# Patient Record
Sex: Male | Born: 1990 | Race: Black or African American | Hispanic: No | Marital: Single
Health system: Southern US, Community
[De-identification: ages and names within clinical notes are randomized; demographics above are authoritative.]

## PROBLEM LIST (undated history)

## (undated) DIAGNOSIS — S161XXA Strain of muscle, fascia and tendon at neck level, initial encounter: Secondary | ICD-10-CM

---

## 2019-12-07 ENCOUNTER — Emergency Department (HOSPITAL_COMMUNITY): Payer: No Typology Code available for payment source

## 2019-12-07 ENCOUNTER — Emergency Department (HOSPITAL_COMMUNITY)
Admission: EM | Admit: 2019-12-07 | Discharge: 2019-12-07 | Disposition: A | Discharge status: Home / Self Care | Payer: No Typology Code available for payment source | Attending: Emergency Medicine | Admitting: Emergency Medicine

## 2019-12-07 ENCOUNTER — Other Ambulatory Visit: Payer: Self-pay

## 2019-12-07 DIAGNOSIS — Y9301 Activity, walking, marching and hiking: Secondary | ICD-10-CM | POA: Insufficient documentation

## 2019-12-07 DIAGNOSIS — S79922A Unspecified injury of left thigh, initial encounter: Secondary | ICD-10-CM | POA: Diagnosis present

## 2019-12-07 DIAGNOSIS — Y92410 Unspecified street and highway as the place of occurrence of the external cause: Secondary | ICD-10-CM | POA: Insufficient documentation

## 2019-12-07 DIAGNOSIS — Y999 Unspecified external cause status: Secondary | ICD-10-CM | POA: Diagnosis not present

## 2019-12-07 DIAGNOSIS — S7012XA Contusion of left thigh, initial encounter: Secondary | ICD-10-CM

## 2019-12-07 DIAGNOSIS — R079 Chest pain, unspecified: Secondary | ICD-10-CM | POA: Insufficient documentation

## 2019-12-07 LAB — CBC WITH DIFFERENTIAL/PLATELET
Abs Immature Granulocytes: 0.02 10*3/uL (ref 0.00–0.07)
Basophils Absolute: 0.1 10*3/uL (ref 0.0–0.1)
Basophils Relative: 1 %
Eosinophils Absolute: 0.1 10*3/uL (ref 0.0–0.5)
Eosinophils Relative: 2 %
HCT: 47.8 % (ref 39.0–52.0)
Hemoglobin: 16.4 g/dL (ref 13.0–17.0)
Immature Granulocytes: 0 %
Lymphocytes Relative: 37 %
Lymphs Abs: 2.9 10*3/uL (ref 0.7–4.0)
MCH: 33.7 pg (ref 26.0–34.0)
MCHC: 34.3 g/dL (ref 30.0–36.0)
MCV: 98.2 fL (ref 80.0–100.0)
Monocytes Absolute: 0.6 10*3/uL (ref 0.1–1.0)
Monocytes Relative: 7 %
Neutro Abs: 4.1 10*3/uL (ref 1.7–7.7)
Neutrophils Relative %: 53 %
Platelets: 186 10*3/uL (ref 150–400)
RBC: 4.87 MIL/uL (ref 4.22–5.81)
RDW: 12.4 % (ref 11.5–15.5)
WBC: 7.8 10*3/uL (ref 4.0–10.5)
nRBC: 0 % (ref 0.0–0.2)

## 2019-12-07 LAB — COMPREHENSIVE METABOLIC PANEL
ALT: 44 U/L (ref 0–44)
AST: 43 U/L — ABNORMAL HIGH (ref 15–41)
Albumin: 4.1 g/dL (ref 3.5–5.0)
Alkaline Phosphatase: 76 U/L (ref 38–126)
Anion gap: 7 (ref 5–15)
BUN: 11 mg/dL (ref 6–20)
CO2: 24 mmol/L (ref 22–32)
Calcium: 9 mg/dL (ref 8.9–10.3)
Chloride: 108 mmol/L (ref 98–111)
Creatinine, Ser: 1.02 mg/dL (ref 0.61–1.24)
GFR calc Af Amer: >60 mL/min (ref >60)
GFR calc non Af Amer: >60 mL/min (ref >60)
Glucose, Bld: 93 mg/dL (ref 70–99)
Potassium: 3.4 mmol/L — ABNORMAL LOW (ref 3.5–5.1)
Sodium: 139 mmol/L (ref 135–145)
Total Bilirubin: 0.6 mg/dL (ref 0.3–1.2)
Total Protein: 7.6 g/dL (ref 6.5–8.1)

## 2019-12-07 MED ORDER — ACETAMINOPHEN 500 MG PO TABS
1000.0000 mg | ORAL_TABLET | Freq: Once | ORAL | Status: AC
  Administered 2019-12-07: 1000 mg via ORAL
  Filled 2019-12-07: qty 2
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
## 2020-10-23 ENCOUNTER — Emergency Department (HOSPITAL_COMMUNITY): Payer: Self-pay

## 2020-10-23 ENCOUNTER — Emergency Department (HOSPITAL_COMMUNITY)
Admission: EM | Admit: 2020-10-23 | Discharge: 2020-10-23 | Disposition: A | Discharge status: Home / Self Care | Payer: Self-pay | Attending: Emergency Medicine | Admitting: Emergency Medicine

## 2020-10-23 DIAGNOSIS — Z23 Encounter for immunization: Secondary | ICD-10-CM | POA: Insufficient documentation

## 2020-10-23 DIAGNOSIS — S0993XA Unspecified injury of face, initial encounter: Secondary | ICD-10-CM

## 2020-10-23 DIAGNOSIS — W320XXA Accidental handgun discharge, initial encounter: Secondary | ICD-10-CM | POA: Insufficient documentation

## 2020-10-23 DIAGNOSIS — S0101XA Laceration without foreign body of scalp, initial encounter: Secondary | ICD-10-CM

## 2020-10-23 LAB — CBC
HCT: 45.8 % (ref 39.0–52.0)
Hemoglobin: 15.3 g/dL (ref 13.0–17.0)
MCH: 32.6 pg (ref 26.0–34.0)
MCHC: 33.4 g/dL (ref 30.0–36.0)
MCV: 97.4 fL (ref 80.0–100.0)
Platelets: 232 10*3/uL (ref 150–400)
RBC: 4.7 MIL/uL (ref 4.22–5.81)
RDW: 11.9 % (ref 11.5–15.5)
WBC: 8.4 10*3/uL (ref 4.0–10.5)
nRBC: 0 % (ref 0.0–0.2)

## 2020-10-23 LAB — COMPREHENSIVE METABOLIC PANEL
ALT: 21 U/L (ref 0–44)
AST: 30 U/L (ref 15–41)
Albumin: 3.9 g/dL (ref 3.5–5.0)
Alkaline Phosphatase: 61 U/L (ref 38–126)
Anion gap: 13 (ref 5–15)
BUN: 16 mg/dL (ref 6–20)
CO2: 19 mmol/L — ABNORMAL LOW (ref 22–32)
Calcium: 9.4 mg/dL (ref 8.9–10.3)
Chloride: 107 mmol/L (ref 98–111)
Creatinine, Ser: 1.06 mg/dL (ref 0.61–1.24)
GFR, Estimated: >60 mL/min (ref >60)
Glucose, Bld: 90 mg/dL (ref 70–99)
Potassium: 3.9 mmol/L (ref 3.5–5.1)
Sodium: 139 mmol/L (ref 135–145)
Total Bilirubin: 0.5 mg/dL (ref 0.3–1.2)
Total Protein: 7.2 g/dL (ref 6.5–8.1)

## 2020-10-23 LAB — ETHANOL: Alcohol, Ethyl (B): 143 mg/dL — ABNORMAL HIGH (ref <10)

## 2020-10-23 MED ORDER — LIDOCAINE HCL (PF) 1 % IJ SOLN
30.0000 mL | Freq: Once | INTRAMUSCULAR | Status: DC
  Filled 2020-10-23: qty 30

## 2020-10-23 MED ORDER — TETANUS-DIPHTH-ACELL PERTUSSIS 5-2.5-18.5 LF-MCG/0.5 IM SUSY
0.5000 mL | PREFILLED_SYRINGE | Freq: Once | INTRAMUSCULAR | Status: AC
  Administered 2020-10-23: 0.5 mL via INTRAMUSCULAR
  Filled 2020-10-23: qty 0.5

## 2020-10-23 NOTE — Discharge Instructions (Addendum)
You were evaluated in the Emergency Department and after careful evaluation, we did not find any emergent condition requiring admission or further testing in the hospital.  Your exam/testing today was overall reassuring.  CT scans did not show any bleeding on the brain.  We offered suture repair of your scalp laceration, but you declined.  Recommend keeping this area clean and dry with dressing in place.  Please return to the Emergency Department if you experience any worsening of your condition.  Thank you for allowing [place] to be a part of your care.
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
## 2021-11-09 ENCOUNTER — Inpatient Hospital Stay
Admit: 2021-11-09 | Discharge: 2021-11-10 | Disposition: A | Discharge status: Home / Self Care | Attending: Emergency Medicine

## 2021-11-09 ENCOUNTER — Emergency Department: Admit: 2021-11-09

## 2021-11-09 ENCOUNTER — Emergency Department

## 2021-11-09 DIAGNOSIS — S161XXA Strain of muscle, fascia and tendon at neck level, initial encounter: Secondary | ICD-10-CM

## 2021-11-09 MED ORDER — ACETAMINOPHEN 325 MG TABLET
325 mg | ORAL | Status: AC
Start: 2021-11-09 — End: 2021-11-09
  Administered 2021-11-09: 23:00:00 via ORAL

## 2021-11-09 MED FILL — TYLENOL 325 MG TABLET: 325 mg | ORAL | Qty: 2
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
## 2021-11-10 MED ORDER — IBUPROFEN 600 MG TAB
600 mg | ORAL | Status: AC
Start: 2021-11-10 — End: 2021-11-09
  Administered 2021-11-10: via ORAL

## 2021-11-10 MED FILL — IBUPROFEN 600 MG TAB: 600 mg | ORAL | Qty: 1
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
## 2023-01-09 ENCOUNTER — Inpatient Hospital Stay
Admit: 2023-01-09 | Discharge: 2023-01-09 | Disposition: A | Discharge status: Home / Self Care | Payer: MEDICAID | Attending: Emergency Medicine

## 2023-01-09 DIAGNOSIS — L03116 Cellulitis of left lower limb: Secondary | ICD-10-CM

## 2023-01-09 MED ORDER — KETOCONAZOLE POWD
Freq: Two times a day (BID) | 0 refills | Status: DC
Start: 2023-01-09 — End: 2023-05-28

## 2023-01-09 MED ORDER — KETOCONAZOLE 2 % EX CREA
2 % | CUTANEOUS | 0 refills | Status: AC
Start: 2023-01-09 — End: ?

## 2023-01-09 MED ORDER — CEPHALEXIN 500 MG PO CAPS
500 | ORAL_CAPSULE | Freq: Four times a day (QID) | ORAL | 0 refills | Status: AC
Start: 2023-01-09 — End: 2023-01-19

## 2023-01-09 NOTE — ED Triage Notes (Signed)
PT reports he wears steel toe boots at work and when he got off work he began having a burning sensation to left foot and between toes. Pt denies any injury

## 2023-01-09 NOTE — ED Notes (Signed)
HYDROGEN PEROXIDE SOAK PROVIDED TO PATIENT AT THIS TIME PATIENT SOAKING BOTH FEET IN SAME  AT THIS TIME

## 2023-01-09 NOTE — ED Provider Notes (Addendum)
[facility] EMERGENCY DEPT  EMERGENCY DEPARTMENT HISTORY AND PHYSICAL EXAM      Date: 01/09/2023  Patient Name: Bryan Anderson  MRN: [id number]  Birthdate: June 18, 1991  Date of evaluation: 01/09/2023  Provider: Domingo Dimes, MD   Note Started: 1:03 PM EDT 01/09/23    HISTORY OF PRESENT ILLNESS     Chief Complaint   Patient presents with    Foot Pain       History Provided By: Patient    HPI: Bryan Anderson is a 32 y.o. male     PAST MEDICAL HISTORY   Past Medical History:  History reviewed. No pertinent past medical history.    Past Surgical History:  History reviewed. No pertinent surgical history.    Family History:  History reviewed. No pertinent family history.    Social History:       Allergies:  No Known Allergies    PCP: No primary care provider on file.    Current Meds:   No current facility-administered medications for this encounter.     No current outpatient medications on file.       Social Determinants of Health:   Social Determinants of Health     Tobacco Use: Not on file   Alcohol Use: Not on file   Financial Resource Strain: Not on file   Food Insecurity: Not on file   Transportation Needs: Not on file   Physical Activity: Not on file   Stress: Not on file   Social Connections: Not on file   Intimate Partner Violence: Not on file   Depression: Not on file   Housing Stability: Not on file   Interpersonal Safety: Not on file   Utilities: Not on file       PHYSICAL EXAM   Physical Exam  Vitals and nursing note reviewed.   Constitutional:       General: He is not in acute distress.     Appearance: Normal appearance. He is normal weight. He is not ill-appearing, toxic-appearing or diaphoretic.   Cardiovascular:      Pulses:           Dorsalis pedis pulses are 3+ on the right side and 3+ on the left side.   Feet:      Right foot:      Skin integrity: Skin breakdown present. No fissure.      Toenail Condition: Right toenails are long.      Left foot:      Skin integrity: Skin breakdown present. No fissure.      Toenail  Condition: Left toenails are long.   Skin:     General: Skin is warm.      Capillary Refill: Capillary refill takes less than 2 seconds.      Findings: Signs of injury and wound present.      Comments: Skin breakdown between toes on left foot and right foot   Neurological:      Mental Status: He is alert.           SCREENINGS              LAB, EKG AND DIAGNOSTIC RESULTS   Labs:  No results found for this or any previous visit (from the past 12 hour(s)).    EKG: Not Applicable    Radiologic Studies:  Non-plain film images such as CT, Ultrasound and MRI are read by the radiologist. Plain radiographic images are visualized and preliminarily interpreted by the ED Physician with the following findings: Not Applicable.  Interpretation per the Radiologist below, if available at the time of this note:  No orders to display        ED COURSE and DIFFERENTIAL DIAGNOSIS/MDM   CC/HPI Summary, DDx, ED Course, and Reassessment: 32 year old male presents with complaint of burning sensation increased pain over the last 3 days to left foot, patient states he wears steel toed boots 7 hours a day 5 days a week, patient states he does wear socks with his boots, patient denies any blunt trauma    Records Reviewed (source and summary of external notes): Prior medical records and Nursing notes    Vitals:    Vitals:    01/09/23 1250 01/09/23 1251   BP:  (!) 144/86   Pulse:  64   Resp:  18   Temp: 98.1 F (36.7 C)    TempSrc: Tympanic    SpO2:  100%        ED COURSE  Hydroperoxide soak       Disposition Considerations (Tests not done, Shared Decision Making, Pt Expectation of Test or Treatment.):  Patient instructed to keep feet dry use antifungal cream and patient prescribed p.o. antibiotics    Patient was given the following medications:  Medications - No data to display    CONSULTS: (Who and What was discussed)  None     Social Determinants affecting Dx or Tx: None    Smoking Cessation: Not Applicable    PROCEDURES   Unless otherwise  noted above, none.  Procedures      CRITICAL CARE TIME   Patient does not meet Critical Care Time, 0 minutes    FINAL IMPRESSION   No diagnosis found.      DISPOSITION/PLAN   DISPOSITION      Discharge Note: The patient is stable for discharge home. The signs, symptoms, diagnosis, and discharge instructions have been discussed, understanding conveyed, and agreed upon. The patient is to follow up as recommended or return to ER should their symptoms worsen.      PATIENT REFERRED TO:  No follow-up provider specified.      DISCHARGE MEDICATIONS:     Medication List      You have not been prescribed any medications.           DISCONTINUED MEDICATIONS:  There are no discharge medications for this patient.      I am the Primary Clinician of Record: Domingo Dimes, MD (electronically signed)    (Please note that parts of this dictation were completed with voice recognition software. Quite often unanticipated grammatical, syntax, homophones, and other interpretive errors are inadvertently transcribed by the computer software. Please disregards these errors. Please excuse any errors that have escaped final proofreading.)     Domingo Dimes, MD  01/09/23 1305       Domingo Dimes, MD  01/09/23 1307

## 2023-01-09 NOTE — Discharge Instructions (Addendum)
Keep feet dry, place gauze or cotton balls between toes to prevent skin to skin contact until the skin heals

## 2023-05-28 ENCOUNTER — Inpatient Hospital Stay
Admit: 2023-05-28 | Discharge: 2023-05-28 | Disposition: A | Discharge status: Home / Self Care | Payer: MEDICAID | Admitting: Emergency Medicine

## 2023-05-28 DIAGNOSIS — B353 Tinea pedis: Secondary | ICD-10-CM

## 2023-05-28 MED ORDER — FLUCONAZOLE 150 MG PO TABS
150 | ORAL_TABLET | ORAL | 0 refills | Status: AC
Start: 2023-05-28 — End: ?

## 2023-05-28 NOTE — Discharge Instructions (Signed)
 Get butenafine cream OTC.    Follow-up with podiatry, Dr.Vihang Rica Koyanagi , call for appointment [phone].

## 2023-05-28 NOTE — ED Triage Notes (Signed)
 Pt has a right foot infection that he is taking an antibiotic for but he feels that it is not getting better and it is painful rating it 8/10 ATT.

## 2023-05-28 NOTE — ED Notes (Signed)
 [facility] EMERGENCY DEPT  EMERGENCY DEPARTMENT ENCOUNTER      Pt Name: Bryan Anderson  MRN: [id number]  Birthdate [birth year]  Date of evaluation: 05/28/2023  Provider: Belia Heman. Ikram Riebe, MD  1:07 PM    CHIEF COMPLAINT       Chief Complaint   Patient presents with    Foot
